# Patient Record
Sex: Male | Born: 1985 | Hispanic: Yes | Marital: Single | Smoking: Never smoker
Health system: Southern US, Community
[De-identification: ages and names within clinical notes are randomized; demographics above are authoritative.]

---

## 2014-03-18 ENCOUNTER — Encounter (HOSPITAL_BASED_OUTPATIENT_CLINIC_OR_DEPARTMENT_OTHER): Payer: Self-pay | Admitting: *Deleted

## 2014-03-18 ENCOUNTER — Emergency Department (HOSPITAL_BASED_OUTPATIENT_CLINIC_OR_DEPARTMENT_OTHER)
Admission: EM | Admit: 2014-03-18 | Discharge: 2014-03-19 | Disposition: A | Discharge status: Home / Self Care | Payer: Self-pay | Attending: Emergency Medicine | Admitting: Emergency Medicine

## 2014-03-18 ENCOUNTER — Emergency Department (HOSPITAL_BASED_OUTPATIENT_CLINIC_OR_DEPARTMENT_OTHER)
Admission: EM | Admit: 2014-03-18 | Discharge: 2014-03-18 | Disposition: A | Discharge status: Home / Self Care | Payer: Self-pay | Attending: Emergency Medicine | Admitting: Emergency Medicine

## 2014-03-18 ENCOUNTER — Encounter (HOSPITAL_BASED_OUTPATIENT_CLINIC_OR_DEPARTMENT_OTHER): Payer: Self-pay | Admitting: Emergency Medicine

## 2014-03-18 DIAGNOSIS — R51 Headache: Secondary | ICD-10-CM | POA: Insufficient documentation

## 2014-03-18 DIAGNOSIS — R519 Headache, unspecified: Secondary | ICD-10-CM

## 2014-03-18 DIAGNOSIS — G44209 Tension-type headache, unspecified, not intractable: Secondary | ICD-10-CM | POA: Insufficient documentation

## 2014-03-18 LAB — CBG MONITORING, ED: Glucose-Capillary: 94 mg/dL (ref 70–99)

## 2014-03-18 MED ORDER — HYDROCODONE-ACETAMINOPHEN 5-325 MG PO TABS: 1.0000 | ORAL_TABLET | Freq: Four times a day (QID) | ORAL | Status: AC | PRN

## 2014-03-18 MED ORDER — KETOROLAC TROMETHAMINE 30 MG/ML IJ SOLN
30.0000 mg | Freq: Once | INTRAMUSCULAR | Status: AC
  Administered 2014-03-18: 30 mg via INTRAVENOUS
  Filled 2014-03-18: qty 1

## 2014-03-18 MED ORDER — SODIUM CHLORIDE 0.9 % IV BOLUS (SEPSIS)
1000.0000 mL | Freq: Once | INTRAVENOUS | Status: AC
  Administered 2014-03-18: 1000 mL via INTRAVENOUS

## 2014-03-18 NOTE — Discharge Instructions (Signed)

## 2014-03-18 NOTE — ED Notes (Signed)
C/o h/a x 3 days 

## 2014-03-18 NOTE — ED Provider Notes (Signed)
CSN: [id number]     Arrival date & time 03/18/14  0106 History   First MD Initiated Contact with Patient 03/18/14 0117     Chief Complaint  Patient presents with  . Headache     (Consider location/radiation/quality/duration/timing/severity/associated sxs/prior Treatment) HPI  This is a 28 year old male with no significant past medical history. He is here was a three-day history of headache. Headache is described as tightness in his forehead associated with tightness in his neck. It is worse when he moves his head or neck. He describes it as severe. He has taken Tylenol and NyQuil without relief. There is no associated photophobia, blurred vision, nausea, vomiting, focal numbness or weakness.  History reviewed. No pertinent past medical history. History reviewed. No pertinent past surgical history. No family history on file. History  Substance Use Topics  . Smoking status: Never Smoker   . Smokeless tobacco: Not on file  . Alcohol Use: Yes    Review of Systems  All other systems reviewed and are negative.   Allergies  Review of patient's allergies indicates no known allergies.  Home Medications   Prior to Admission medications   Not on File   BP 153/91 mmHg  Pulse 110  Temp(Src) 98.2 F (36.8 C) (Oral)  Resp 18  Ht 5\' 9"  (1.753 m)  Wt 222 lb (100.699 kg)  BMI 32.77 kg/m2  SpO2 98%   Physical Exam  General: Well-developed, well-nourished male in no acute distress; appearance consistent with age of record HENT: normocephalic; atraumatic; no tenderness on percussion of sinuses Eyes: pupils equal, round and reactive to light; extraocular muscles intact Neck: supple Heart: regular rate and rhythm; tachycardia Lungs: clear to auscultation bilaterally Abdomen: soft; nondistended; nontender; no masses or hepatosplenomegaly; bowel sounds present Extremities: No deformity; full range of motion Neurologic: Awake, alert and oriented; motor function intact in all extremities  and symmetric; no facial droop; normal coordination and speech; negative Romberg; normal finger to nose; normal gait Skin: Warm and dry Psychiatric: Normal mood and affect    ED Course  Procedures (including critical care time)   MDM  2:28 AM Patient symptoms are consistent with a tension headache. Has not gotten significant relief with IV fluids and Toradol. We'll give him a short course of narcotic to try at home.  Hanley SeamenJohn L Terriana Barreras, MD 03/18/14 [phone]

## 2014-03-18 NOTE — ED Notes (Signed)
C/o ha x 3 days,  Took tylenol yesterday and nyquil last pm

## 2014-03-18 NOTE — ED Notes (Signed)
Headache   Seen last pm for same

## 2014-03-19 ENCOUNTER — Emergency Department (HOSPITAL_BASED_OUTPATIENT_CLINIC_OR_DEPARTMENT_OTHER): Payer: Self-pay

## 2014-03-19 MED ORDER — KETOROLAC TROMETHAMINE 30 MG/ML IJ SOLN
30.0000 mg | Freq: Once | INTRAMUSCULAR | Status: AC
  Administered 2014-03-19: 30 mg via INTRAVENOUS
  Filled 2014-03-19: qty 1

## 2014-03-19 MED ORDER — DIPHENHYDRAMINE HCL 50 MG/ML IJ SOLN
25.0000 mg | Freq: Once | INTRAMUSCULAR | Status: AC
  Administered 2014-03-19: 25 mg via INTRAVENOUS
  Filled 2014-03-19: qty 1

## 2014-03-19 MED ORDER — METOCLOPRAMIDE HCL 5 MG/ML IJ SOLN
10.0000 mg | Freq: Once | INTRAMUSCULAR | Status: AC
  Administered 2014-03-19: 10 mg via INTRAVENOUS
  Filled 2014-03-19: qty 2

## 2014-03-19 MED ORDER — DEXAMETHASONE SODIUM PHOSPHATE 10 MG/ML IJ SOLN
10.0000 mg | Freq: Once | INTRAMUSCULAR | Status: AC
  Administered 2014-03-19: 10 mg via INTRAVENOUS
  Filled 2014-03-19: qty 1

## 2014-03-19 MED ORDER — SODIUM CHLORIDE 0.9 % IV BOLUS (SEPSIS)
1000.0000 mL | Freq: Once | INTRAVENOUS | Status: AC
  Administered 2014-03-19: 1000 mL via INTRAVENOUS

## 2014-03-19 NOTE — ED Provider Notes (Signed)
CSN: [id number]     Arrival date & time 03/18/14  2332 History   First MD Initiated Contact with Patient 03/19/14 0134     Chief Complaint  Patient presents with  . Headache     (Consider location/radiation/quality/duration/timing/severity/associated sxs/prior Treatment) HPI Comments: Patient is a 28 year old male with no significant past medical history. He presents with complaints of persistent headache for the past 5 days. States the pain is around his eyes, forehead, and top of his head. He denies any fevers or chills. He denies any stiff neck. He was seen here last night for similar complaints and was given Toradol with some relief. He was given pain medication, however his pain persists.  Patient is a 28 y.o. male presenting with headaches. The history is provided by the patient.  Headache Pain location:  Generalized Quality:  Dull Radiates to:  Does not radiate Onset quality:  Gradual Duration:  5 days Timing:  Constant Progression:  Worsening Chronicity:  New Similar to prior headaches: no   Context: activity and bright light   Relieved by:  Nothing Worsened by:  Nothing tried Ineffective treatments:  None tried Associated symptoms: no blurred vision, no fever, no neck pain and no visual change     History reviewed. No pertinent past medical history. History reviewed. No pertinent past surgical history. No family history on file. History  Substance Use Topics  . Smoking status: Never Smoker   . Smokeless tobacco: Not on file  . Alcohol Use: Yes    Review of Systems  Constitutional: Negative for fever.  Eyes: Negative for blurred vision.  Musculoskeletal: Negative for neck pain.  Neurological: Positive for headaches.  All other systems reviewed and are negative.     Allergies  Review of patient's allergies indicates no known allergies.  Home Medications   Prior to Admission medications   Medication Sig Start Date End Date Taking? Authorizing Provider   HYDROcodone-acetaminophen (NORCO) 5-325 MG per tablet Take 1-2 tablets by mouth every 6 (six) hours as needed (for headache). 03/18/14   John L Molpus, MD   BP 152/85 mmHg  Pulse 94  Temp(Src) 98.3 F (36.8 C) (Oral)  Resp 20  Ht 5\' 9"  (1.753 m)  Wt 232 lb (105.235 kg)  BMI 34.24 kg/m2  SpO2 98% Physical Exam  Constitutional: He is oriented to person, place, and time. He appears well-developed and well-nourished. No distress.  HENT:  Head: Normocephalic and atraumatic.  Mouth/Throat: Oropharynx is clear and moist.  Eyes: EOM are normal. Pupils are equal, round, and reactive to light.  There is no papilledema on funduscopic exam  Neck: Normal range of motion. Neck supple.  Cardiovascular: Normal rate, regular rhythm and normal heart sounds.   No murmur heard. Pulmonary/Chest: Effort normal and breath sounds normal. No respiratory distress. He has no wheezes.  Abdominal: Soft. Bowel sounds are normal. He exhibits no distension. There is no tenderness.  Musculoskeletal: Normal range of motion. He exhibits no edema.  Lymphadenopathy:    He has no cervical adenopathy.  Neurological: He is alert and oriented to person, place, and time. No cranial nerve deficit. He exhibits normal muscle tone. Coordination normal.  Skin: Skin is warm and dry. He is not diaphoretic.  Nursing note and vitals reviewed.   ED Course  Procedures (including critical care time) Labs Review Labs Reviewed - No data to display  Imaging Review No results found.   EKG Interpretation None      MDM   Final diagnoses:  None  Patient feels better with medications given. His neurologic exam is nonfocal and CT scan of the head is unremarkable. He will be discharged with continued pain medication as prescribed last night. He is to follow-up with his primary Dr. if not improving and return to the ER if his symptoms substantially worsen or change. He has no fever and no nuchal rigidity and I do not feel as  though an LP is indicated.    Geoffery Lyonsouglas Chip Canepa, MD 03/19/14 [phone]

## 2014-03-19 NOTE — Discharge Instructions (Signed)

## 2015-04-14 IMAGING — CT CT HEAD W/O CM
1 series · 16 of 30 positions shown, 20 images · non-contrast
Comparison: None.

CLINICAL DATA: Severe headaches for 3 days.  Nausea and vomiting.

EXAM:
CT HEAD WITHOUT CONTRAST
TECHNIQUE: Contiguous axial images were obtained from the base of the skull
through the vertex without intravenous contrast.

[Series 2: head 4.8 h37s · axial · 0.46mm/px · z∈[+1225,+1381]mm · 16 of 36 slices shown, 20 images]
[im 2/36  brain]
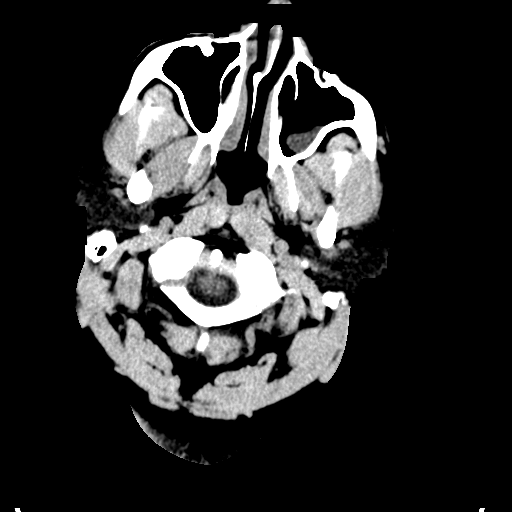
[im 2/36  bone]
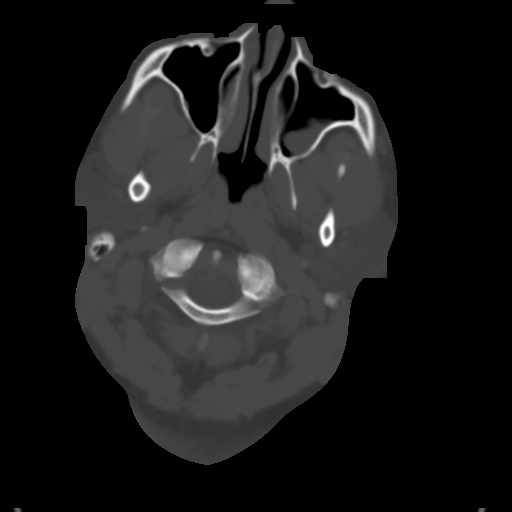
[im 4/36  brain]
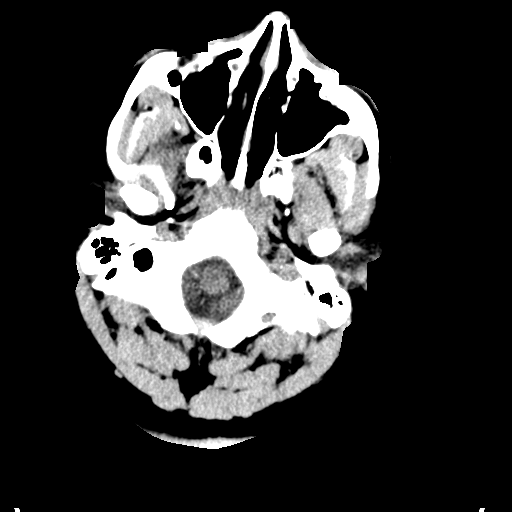
[im 7/36  brain]
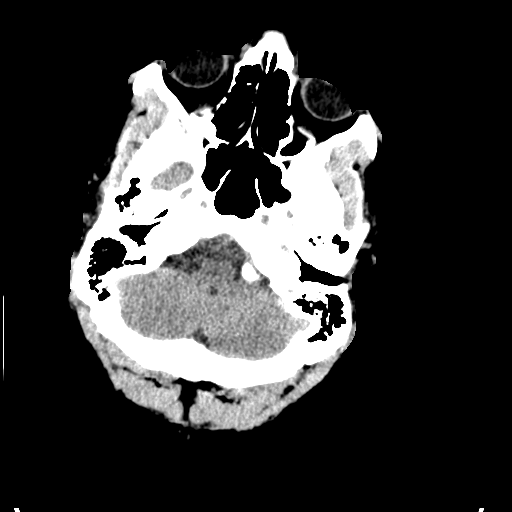
[im 9/36  brain]
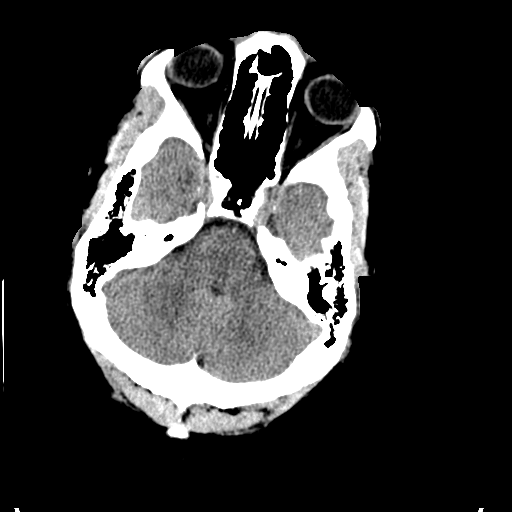
[im 10/36  brain]
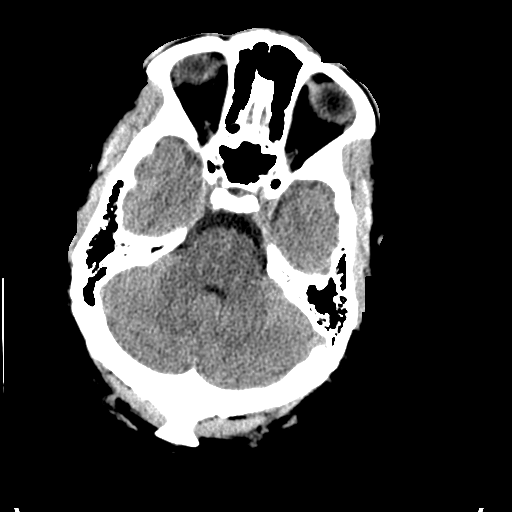
[im 10/36  bone]
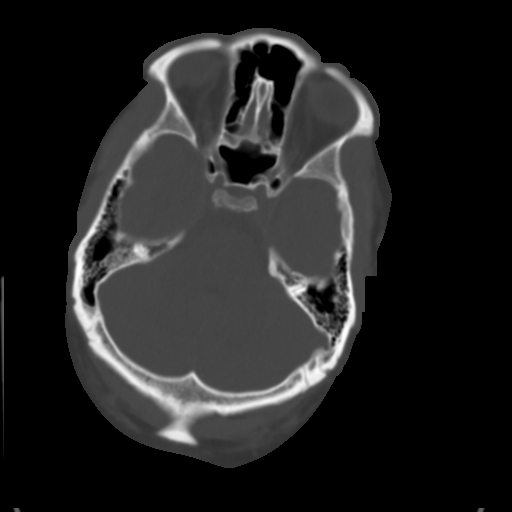
[im 13/36  brain]
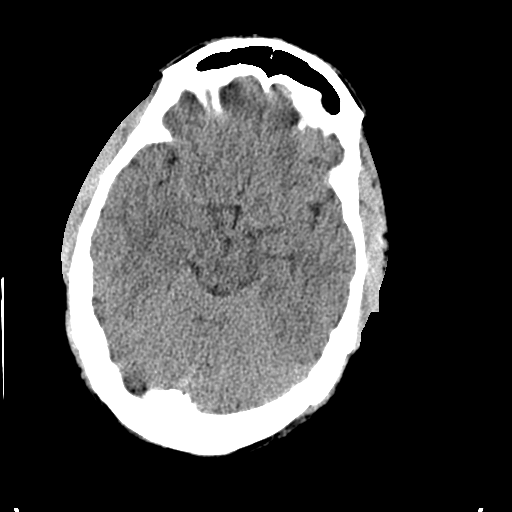
[im 15/36  brain]
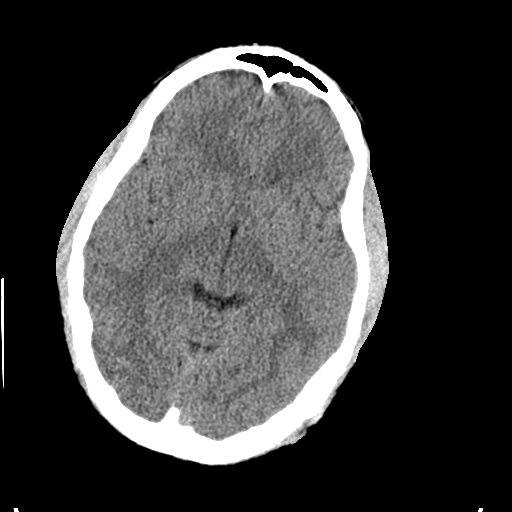
[im 17/36  brain]
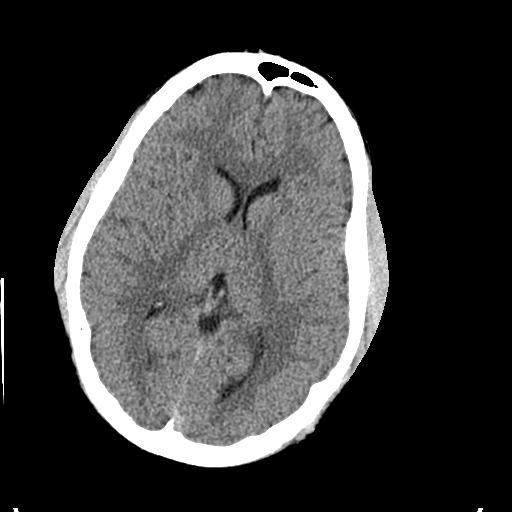
[im 19/36  brain]
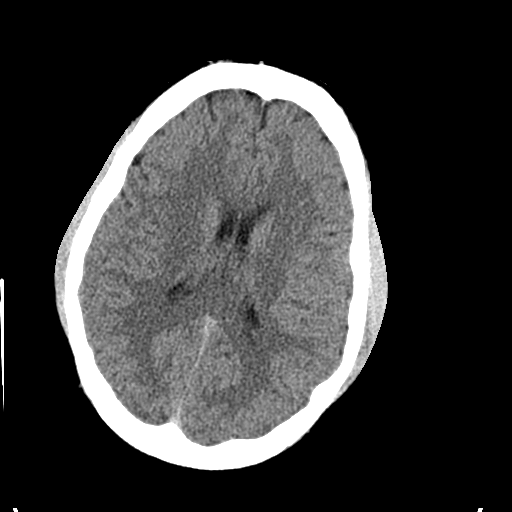
[im 19/36  bone]
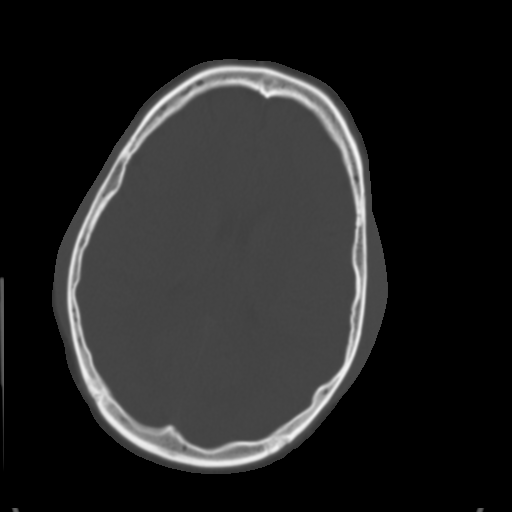
[im 21/36  brain]
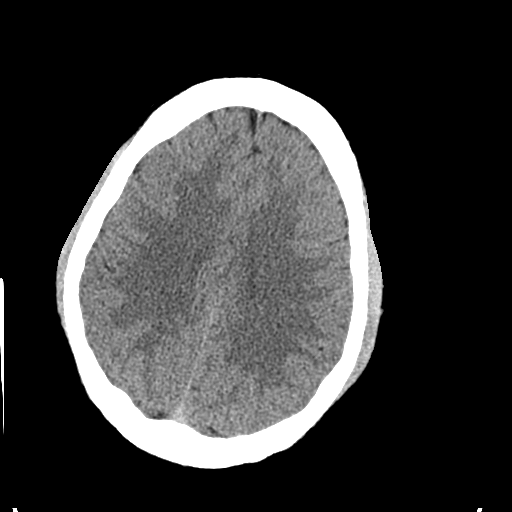
[im 23/36  brain]
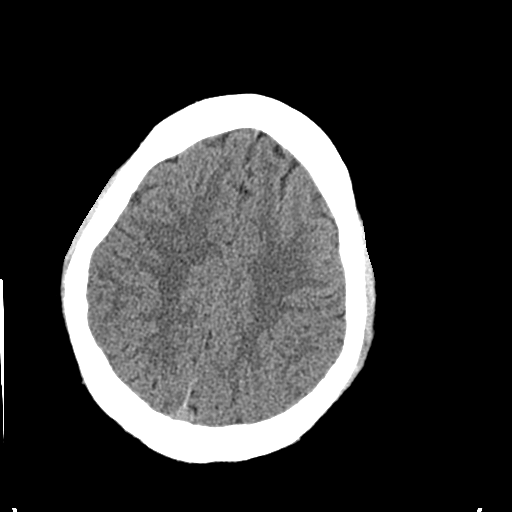
[im 26/36  brain]
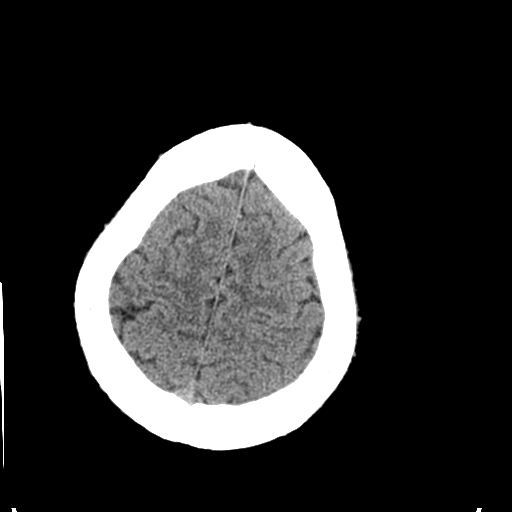
[im 27/36  brain]
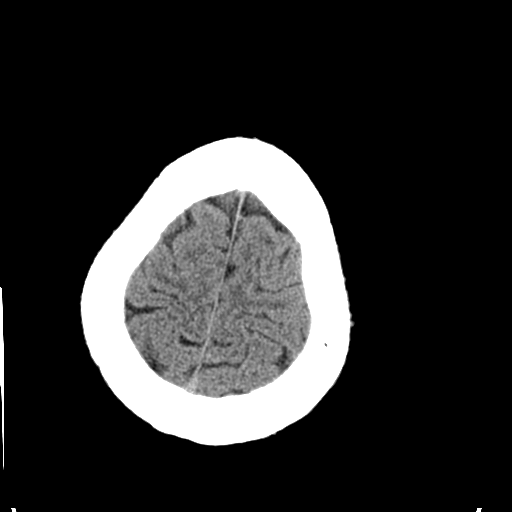
[im 27/36  bone]
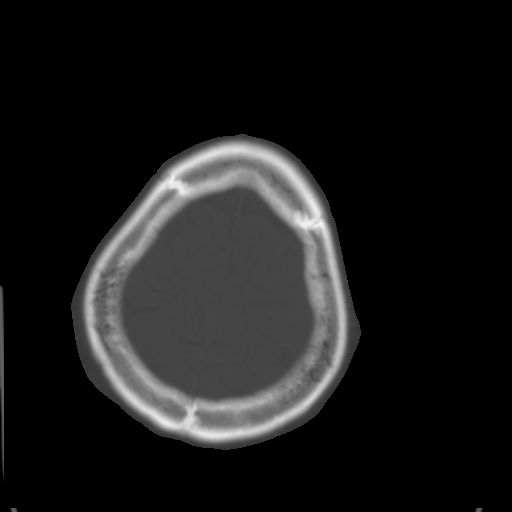
[im 29/36  brain]
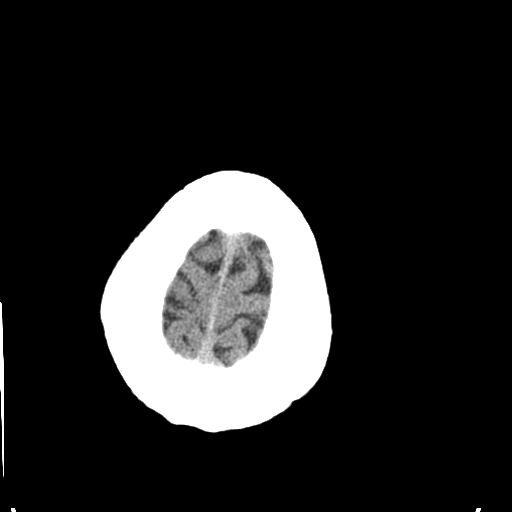
[im 32/36  brain]
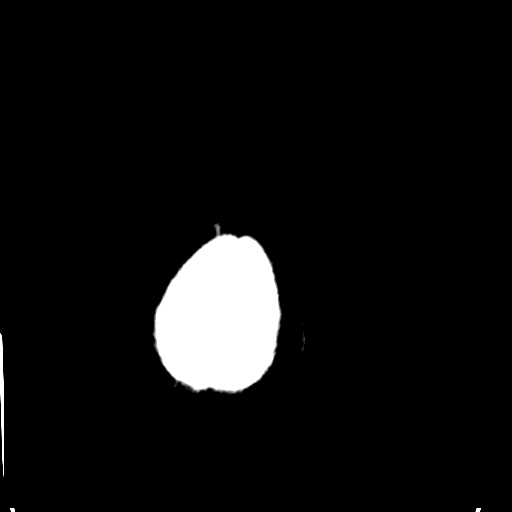
[im 34/36  brain]
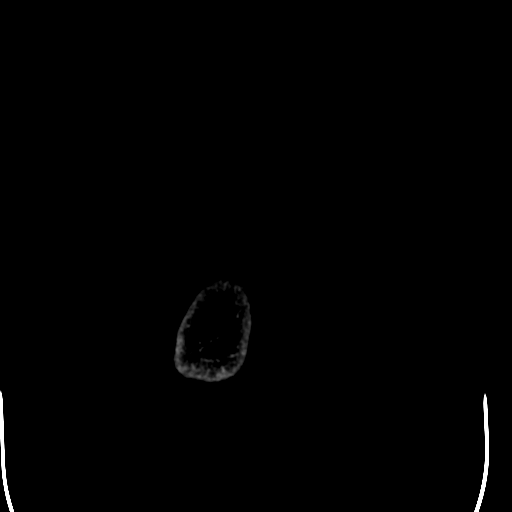

[16 of 30 positions shown; findings below may reference images not displayed]

FINDINGS: No acute cortical infarct, hemorrhage, or mass lesion ispresent.
Ventricles are of normal size. No significant extra-axial fluid
collection is present. Retention cyst or polyp in the left maxillary
sinus. The remaining paranasal sinuses are clear. The mastoid air
cells are clear. The osseous skull is intact.
IMPRESSION: 1. No acute intracranial abnormalities.
